# Patient Record
Sex: Female | Born: 1960 | Race: White | Hispanic: No | Marital: Married | Smoking: Current every day smoker
Health system: Southern US, Community
[De-identification: ages and names within clinical notes are randomized; demographics above are authoritative.]

## PROBLEM LIST (undated history)

## (undated) NOTE — ED Notes (Signed)
 Formatting of this note might be different from the original. Quiet , watching TV  Ceda Patti Pouch, RN 03/29/18 1735  Electronically signed by Devona Patti Pouch, RN at 03/29/2018  5:35 PM EST

## (undated) NOTE — ED Notes (Signed)
 Formatting of this note might be different from the original. Nurse Clotilda in room with pt  Electronically signed by Avelina Jenkins Krone, SITTER at 03/28/2018  4:15 PM EST

## (undated) NOTE — ED Notes (Signed)
 Formatting of this note might be different from the original. Pt daughter called to check on mom. Daughter left number for pt to call her at when she wakes up [phone]  [facility] Psych Tech II  Electronically signed by Joesph Norris Humphrey, Tech at 03/29/2018  8:40 AM EST

## (undated) NOTE — ED Notes (Signed)
 Formatting of this note might be different from the original. [facility] AT PT BEDSIDE. Electronically signed by Vinie Annah Bunker, SITTER at 03/30/2018  1:24 PM EST

## (undated) NOTE — ED Notes (Signed)
 Formatting of this note might be different from the original. Pt up to restroom. Electronically signed by Dickie Ezzard Stacks, ED Tech at 03/30/2018  4:42 AM EST

## (undated) NOTE — ED Notes (Signed)
 Formatting of this note might be different from the original. Pt using unit phone. Electronically signed by Dickie Ezzard Stacks, ED Tech at 03/29/2018  7:22 PM EST

## (undated) NOTE — ED Notes (Signed)
 Formatting of this note might be different from the original. Psych Tech informed Pt and daughter about the Pt's IVC status and the plan for the [facility] wait list and refer out to appropriate facility. The Pt and the daughter were upset about this decision. Staff tried to explain that the Dr's and RN felt that she was a danger to herself and others. The Pt was unhappy with this statement and requested to see the paper that she signed that said that. Staff tried to explain that it was not a signed paper and that the Dr filled it out. The Pt's daughter said repeatedly I cannot leave her here Staff explained that the daughter could stay till 10 pm tonight and be back at 8 am the next day. The daughter requested to speak with the Dr that made the decision Staff told the Pt's daughter that would most likely not happen but Staff would ask any ways.   Addendum: Assessment RN spoke with the Pt and the mother about the reason for the IVC   Nidia Rumalda Bucco  Psych Tech II  Electronically signed by Nidia Rumalda Bucco, Tech at 03/28/2018  3:07 PM EST Electronically signed by Nidia Rumalda Bucco, Tech at 03/29/2018  1:50 PM EST

## (undated) NOTE — Consults (Signed)
 Formatting of this note is different from the original. Psychiatry Consult Note [facility] [address] [place], [place] [address] Name: Jackie Hernandez DOB: Aug 08, 1960 HAR: [id number] MRN: [id number] Date: 03/31/2018  Referral from ED Provider Dr. Nancyann Victorio RADDLE  HPI:   This is a 89 y.o. old female who presented to the ED for Mania on 03/28/2018.  Patient has medical history of  Bipolar Disorder. Patient notes that she was in mania  which started 2 weeks ago. Patient reports the severity of this problem is 0/10, currently, with 10 being the most severe. The patient reports the following associated signs and symptoms with their diagnosis lack of sleep, racing thoughts, high energy. The patient reports that This Zyprexa helps makes their symptoms better and that Not taking medications makes their symptoms worse.  Called daughter whom notes that patient is doing better and is willing to bring her home.  Per nurse screener note on 03/31/2018 Pt is a 42 yr old female with a history of Bipolar Disorder.  Pt reports no sleep for last 2 lights and having bad thoughts, racing thoughts.  Pt here with request for Abilify to hold her until she sees her regular psychiatrist in late February or early March.    Pt reports that she used to take Ability Maintenna monthly but stopped it about 2 yrs ago because she thoughts she was better.  She is taking alprazolalm 1-2 mg per day she states to help her calm down.  Pt has rapid and pressured speech during interview.  She appears to be minimizing her symptoms as her daughter often added details that pt failed to mention.  Pt denied hallucinations initially but her daughter talked to her about telling the truth.  Pt then admitted to having unusual smells and adds that she has had olfactory hallucinations in the past.  She reports being hospitalized several times in [place] but denies need for hospitalization today.   Pt's  daughter states that they tried to get her help in the outpatient setting but were unable to get her an appointment. According to pt's daughter pt has had several days of manic behavior and has actually been looking for things in the house that aren't there and destroying things  Psychiatric ROS: Patient denies recent hx of:  Thoughts of harming self or others  helplessness, hopelessness, worthlessness  trouble with sleep, energy, concentration, libido, or appetite  auditory hallucinations, visual hallucinations, paranoia, delusion, or IOR  periods of time with decreased sleep, increased energy, hypereligiosity, or grandiosity  nightmares or flashbacks from any prior traumas   Obessions, compulsions, rituals  overwhelming anxiety, irritability, restlessness or episodes consistent with panic  Medical ROS ROS  Past Psychiatric History: Psychiatrist:  In [place] but has been inactive for 2 yrs Therapist:  Nell but hasn't seen Hospitalizations: several in [facility] and Psychiatric History: Family member with psychiatric or mental illness: None reported   Social History: Living situation:  With husband Marital Status: married Children:  1 Social History   Socioeconomic History  ? Marital status: Married    Spouse name: Not on file  ? Number of children: Not on file  ? Years of education: Not on file  ? Highest education level: Not on file  Occupational History  ? Not on file  Social Needs  ? Financial resource strain: Not on file  ? Food insecurity:    Worry: Not on file    Inability: Not on  file  ? Transportation needs:    Medical: Not on file    Non-medical: Not on file  Tobacco Use  ? Smoking status: Former Smoker  ? Smokeless tobacco: Never Used  Substance and Sexual Activity  ? Alcohol use: Yes  ? Drug use: Never  ? Sexual activity: Not on file  Lifestyle  ? Physical activity:    Days per week: Not on file    Minutes per  session: Not on file  ? Stress: Not on file  Relationships  ? Social connections:    Talks on phone: Not on file    Gets together: Not on file    Attends religious service: Not on file    Active member of club or organization: Not on file    Attends meetings of clubs or organizations: Not on file    Relationship status: Not on file  ? Intimate partner violence:    Fear of current or ex partner: Not on file    Emotionally abused: Not on file    Physically abused: Not on file    Forced sexual activity: Not on file  Other Topics Concern  ? Not on file  Social History Narrative  ? Not on file   Substance Abuse History: Denies any alcohol, tobacco or illicit drug use   Past Medical History: Past Medical History:  Diagnosis Date  ? Bipolar 1 disorder (HCC)   ? Schizophrenia (HCC)    Current Medications: Prior to Admission medications   Medication Sig Start Date End Date Taking? Authorizing Provider  olanzapine zydis (ZYPREXA) 5 MG disintegrating tablet Take 1 tablet (5 mg total) by mouth nightly. 03/31/18 04/30/18  Jordan C. Spellman, MD   Allergies: Allergies not on file  Speciality Physical Exam: Psychiatry  Musculoskeletal: No abnormal movements noted  Neurological: Gait normal   Physical Exam Constitutional: BP 122/89   Pulse 83   Temp 97.9 F (36.6 C)   Resp 16   Ht 5' 6 (1.676 m)   Wt 170 lb (77.1 kg)   SpO2 99%   BMI 27.44 kg/m  General:  Alert, oriented xperson, place and time, no acute distress  Mental Status Exam:  Appearance & Hygiene: Appearance: alert, well appearing, and in no distress and oriented to person, place, and time. Psychomotor Activity:within normal limits Eye Contact: good  Attitude: good  Mood:Euthymic  Affect: appropriate Speech: normal pitch and normal volume Thought Process: logical connections Associations: tight Perception (Hallucinations): denies delusions and hallucinations Thought Content:denies suicidal, homicidal and  paranoid ideation Insight: fair  Judgement: good   Cognition: Orientation:person, place and time Memory: intact Attention & Concentration: intact Language:fluent and spontaneous without dysarthric features General Fund of Knowledge: intact General Cognition: intact  Labs Recent Labs  Lab 03/28/18 1448  WBC 8.1  HGB 14.3  HCT 43.8  PLT 159*   Recent Labs  Lab 03/28/18 1448  NA 145  K 3.8  CL 111*  CO2 27  BUN 18  CREATININE 0.85   Recent Labs  Lab 03/28/18 1448  TBIL 0.8  AST 40*  ALT 52  ALK 116  ALB 4.0  TP 7.5   No results found for: TSHHIGHSENSI No results found for: LITHIUM Urine Drug Screen Latest Ref Rng & Units 03/28/2018  URINE THC Neg Neg  PHENCYCLIDINE Neg Neg  COCAINE METABOLITES Neg Neg  METHAMP UR Neg Neg  OPIATES Neg Neg  AMPHETAMINES Neg Neg  BENZODIAZEPINES Neg Pos(A)  TRICYCLIC ANTIDEP Neg Neg  METHADONE Neg Neg  BARBITURATES  Neg Neg  OXYCODONE , UR Neg Neg  PROPOXYPHENE, UR Neg Neg  BUPRENORPHINE Neg Neg   EKG/Radiology/Procedures No results found for this or any previous visit.  I have personally reviewed the above radiological and Laboratory data.  Review and summary of old records and/or notes: Per ED provider note  Ikea Demicco is a 40 y.o. female who presents stating she has been out of her medication/Abilify for 2 years.  She is currently staying with her daughter.  On further questioning she has not seen her psychiatrist recently she is out of her medication she has been hallucinating both auditory and visual hallucinations.  She has been running around the house moved furniture because voices have been telling her to.    Assessment/Pan --Mania resolved after several days of Zyprexa 5 mg qhs.  --Labs, and imaging, electrocardiogram reviewed as above.  Problem 1. Bipolar I disorder mre Manic  Discharge with Zyprexa 5 mg qhs   Electronically signed by:  Jordan C. Spellman, MD, 03/31/2018, 18:15   Electronically  signed by Jordan C. Mervyn, MD at 03/31/2018  6:15 PM EST

## (undated) NOTE — ED Notes (Signed)
 Formatting of this note might be different from the original. Nurse Clotilda at Pt bedside  Electronically signed by Avelina Jenkins Krone, SITTER at 03/28/2018  4:28 PM EST

## (undated) NOTE — ED Notes (Signed)
 Formatting of this note might be different from the original. Pt up to restroom.  Ginger ale provided. Electronically signed by Dickie Ezzard Stacks, ED Tech at 03/29/2018  9:59 PM EST

## (undated) NOTE — ED Notes (Signed)
 Formatting of this note might be different from the original. Pt up to restroom. Electronically signed by Dickie Ezzard Stacks, ED Tech at 03/30/2018 12:44 AM EST

## (undated) NOTE — ED Notes (Signed)
 Formatting of this note might be different from the original. PT HAS A FEMALE VISITOR AT BEDSIDE. Electronically signed by Vinie Annah Bunker, SITTER at 03/30/2018  1:12 PM EST

## (undated) NOTE — ED Notes (Signed)
 Formatting of this note might be different from the original. Pt daughter on the unit  Electronically signed by Avelina Jenkins Krone, SITTER at 03/28/2018  3:56 PM EST

## (undated) NOTE — ED Notes (Signed)
 Formatting of this note might be different from the original. Psych tech in room speaking with pt. Electronically signed by Dickie Ezzard Stacks, ED Tech at 03/29/2018  9:04 PM EST

## (undated) NOTE — ED Notes (Signed)
 Formatting of this note might be different from the original. Pt up to restroom. Electronically signed by Dickie Ezzard Stacks, ED Tech at 03/30/2018  3:30 AM EST

## (undated) NOTE — ED Notes (Signed)
 Formatting of this note might be different from the original. Psych tech checked in on pt. Pt provided ginger ale. Pt said she was feeling good and declined therapeutic support. pt confused about wether or not she was supposed to be taking medication while she was here. Nurse alerted.   Joesph Lied Psych tech II Electronically signed by Joesph Almarie Lied, Tech at 03/29/2018 10:18 AM EST Electronically signed by Joesph Almarie Lied, Tech at 03/29/2018 10:28 AM EST Electronically signed by Joesph Almarie Humphrey, Tech at 03/29/2018 10:30 AM EST

## (undated) NOTE — ED Notes (Signed)
 Formatting of this note might be different from the original. Pt up to restroom. Electronically signed by Dickie Ezzard Stacks, ED Tech at 03/29/2018  9:26 PM EST

## (undated) NOTE — ED Notes (Signed)
 Formatting of this note might be different from the original. Psych Tech approached Pt to check in on wellbeing. The Pt said that she is calm and feels much better. The Pt attributes this to calming herself down The Pt stated multiple times that she would have not come to the hospital and just pushed through till her Psychiatrist appointment at the end of February. Staff tried to explain the benefits on inpatient but the Pt was not receptive to this. The Pt still thinks that she will be DCed soon.   Staff offered to get outpt reasourses and the Pt agreed to this. A list of Probationers was provided for the [place] area.   Nidia Rumalda Bucco  Psych Tech II  Electronically signed by Nidia Rumalda Bucco, Tech at 03/29/2018  6:23 PM EST Electronically signed by Nidia Rumalda Bucco, Tech at 03/29/2018  6:24 PM EST

## (undated) NOTE — ED Notes (Signed)
 Formatting of this note might be different from the original. Pt up to restroom Electronically signed by Dickie Ezzard Stacks, ED Tech at 03/29/2018  8:22 PM EST

## (undated) NOTE — ED Notes (Signed)
 Formatting of this note might be different from the original. Psych tech checked in on pt. Pt had no requests or desire to talk,   Joesph Lied Psych Tech II  Electronically signed by Joesph Almarie Lied, Tech at 03/31/2018  3:03 PM EST

## (undated) NOTE — ED Notes (Signed)
 Formatting of this note might be different from the original. Chief Complaint:      History of Present Illness:   Pt is a 2 yr old female with a history of Bipolar Disorder.  Pt reports no sleep for last 2 lights and having bad thoughts, racing thoughts.  Pt here with request for Abilify to hold her until she sees her regular psychiatrist in late February or early March.    Pt reports that she used to take Ability Maintenna monthly but stopped it about 2 yrs ago because she thoughts she was better.  She is taking alprazolalm 1-2 mg per day she states to help her calm down.  Pt has rapid and pressured speech during interview.  She appears to be minimizing her symptoms as her daughter often added details that pt failed to mention.  Pt denied hallucinations initially but her daughter talked to her about telling the truth.  Pt then admitted to having unusual smells and adds that she has had olfactory hallucinations in the past.  She reports being hospitalized several times in [place] but denies need for hospitalization today.   Pt's daughter states that they tried to get her help in the outpatient setting but were unable to get her an appointment. According to pt's daughter pt has had several days of manic behavior and has actually been looking for things in the house that aren't there and destroying things.   Ex- Voluntary/ Involuntary?  Involuntary How presented to ed?  Daughter brought in    History of MI?  Bipolar Disorder SI? Plan?  denies HI?Plan?  deies AVH?  olfactory Paranoia? denies  Sleep?  no  Psychiatric History: Psychiatrist:  In [place] but has been inactive for 2 yrs Therapist:  Nell but hasn't seen Hospitalizations: several in [place]   Suicide attempts:  denies Previous medications:  Numerous antidepressants but states only a few mood stabilizers History of seizure or head injury:  denies Suicide completion in the family:  denies  Substance Abuse/Use  History: Alcohol use:  Rare per pt - last drink a glass of wine 3 days ago Illicit drug use:  denies Prescription drug abuse:  denies Smoker:  denies Rehab/Detox:  denies  Medical Issues:  PCP:  none Current issues:  Bipolar Disorder, Schizophrenia per daughter   Home Medications: Compliant /Noncompliant  noncompliant  Social History: Living situation:  With husband Marital Status: married Children:  1 Employment: no Disability:  yes Support System: family Trauma:  denies Education:  High school  History of aggression/ Violence:  denies Access to guns:  denies Legal issues:  denies  Mental Status Exam: Appearance:  Slightly agitated 64 yr old female.  Appropriately dressed in casual clothing Hygiene:  good Speech:  Rapid, pressured Volume:  wnl Type:    Eye Contact:  good Behavior:  Cooperative and hypomanic Affect: anxious Range:  full Mood:  I'm calming myself down Thought Process:  FOI Thought Content:  unremarkable Perceptual Disturbance:  Olfactory hallucinations Psychomotor:  wnl Gait:  wnl  Plan:  Presented to Dr Volanda and he places pt on the [facility] waitlist and refer out for mental health treatment as [facility] is at capacity.  Darice Jerie Ada, RN 03/28/18 [phone]  Electronically signed by Darice Jerie Ada, RN at 03/28/2018  3:07 PM EST

## (undated) NOTE — ED Notes (Signed)
 Formatting of this note might be different from the original. Daughter entered the unit with pt. Electronically signed by Dena Lax, SITTER at 03/28/2018  2:32 PM EST

## (undated) NOTE — ED Notes (Signed)
 Formatting of this note might be different from the original. Daughter has left the unit  Electronically signed by Avelina Jenkins Krone, SITTER at 03/28/2018  4:53 PM EST

## (undated) NOTE — ED Notes (Signed)
 Formatting of this note might be different from the original. DC INSTRUCTIONS REVIEWED WITH PT AND DAUGHTER, BOTH VERBALIZE UNDERSTANDING, PT AMB TO EXIT WITH STEADY GAIT  Ceda Patti Pouch, RN 03/31/18 1550  Electronically signed by Devona Patti Pouch, RN at 03/31/2018  3:50 PM EST

## (undated) NOTE — ED Notes (Signed)
 Formatting of this note might be different from the original. PT VISITOR HAS LEFT THE UNIT. Electronically signed by Vinie Annah Bunker, SITTER at 03/30/2018  1:28 PM EST

## (undated) NOTE — ED Notes (Signed)
 Formatting of this note might be different from the original. Pt is on phone.  Electronically signed by Isaiah Charlies [place], Tech at 03/29/2018  1:36 PM EST

## (undated) NOTE — ED Notes (Signed)
 Formatting of this note might be different from the original. TV channel changed Electronically signed by Dickie Ezzard Stacks, ED Tech at 03/29/2018  7:43 PM EST

## (undated) NOTE — ED Notes (Signed)
 Formatting of this note might be different from the original. Pt's visitor (daughter) left.  Electronically signed by Isaiah Charlies [place], Tech at 03/29/2018  1:12 PM EST Electronically signed by Isaiah Charlies [place], Tech at 03/29/2018  1:12 PM EST

## (undated) NOTE — ED Provider Notes (Signed)
 Formatting of this note is different from the original. eMERGENCY dEPARTMENT eNCOUnter    CHIEF COMPLAINT   Chief Complaint  Patient presents with  ? Depression    hx of bipolar requesting prescription for ambilify which she took years ago, reports increased depression this last week, denies HI/SI. , has appintment with psychiatrist in February  ? Psychotic Symptoms    Reports auditory & visual hallucinations & hx schizophrenia, daughter states, She's ruining the house looking for things not there   HPI   Jackie Hernandez is a 55 y.o. female who presents stating she has been out of her medication/Abilify for 2 years.  She is currently staying with her daughter.  On further questioning she has not seen her psychiatrist recently she is out of her medication she has been hallucinating both auditory and visual hallucinations.  She has been running around the house moved furniture because voices have been telling her to.    PAST MEDICAL HISTORY   Past Medical History:  Diagnosis Date  ? Bipolar 1 disorder (HCC)   ? Schizophrenia (HCC)    SURGICAL HISTORY   No past surgical history on file.  CURRENT MEDICATIONS   See list  ALLERGIES   Allergies not on file  FAMILY HISTORY   No family history on file.  SOCIAL HISTORY   Social History   Socioeconomic History  ? Marital status: Married    Spouse name: Not on file  ? Number of children: Not on file  ? Years of education: Not on file  ? Highest education level: Not on file  Occupational History  ? Not on file  Social Needs  ? Financial resource strain: Not on file  ? Food insecurity:    Worry: Not on file    Inability: Not on file  ? Transportation needs:    Medical: Not on file    Non-medical: Not on file  Tobacco Use  ? Smoking status: Former Smoker  ? Smokeless tobacco: Never Used  Substance and Sexual Activity  ? Alcohol use: Yes  ? Drug use: Never  ? Sexual activity: Not on file  Lifestyle  ? Physical  activity:    Days per week: Not on file    Minutes per session: Not on file  ? Stress: Not on file  Relationships  ? Social connections:    Talks on phone: Not on file    Gets together: Not on file    Attends religious service: Not on file    Active member of club or organization: Not on file    Attends meetings of clubs or organizations: Not on file    Relationship status: Not on file  ? Intimate partner violence:    Fear of current or ex partner: Not on file    Emotionally abused: Not on file    Physically abused: Not on file    Forced sexual activity: Not on file  Other Topics Concern  ? Not on file  Social History Narrative  ? Not on file   REVIEW OF SYSTEMS   Constitutional:  Denies fever, chills, weight loss.   Eyes:  Denies any vision problems. HENT:  Denies sore throat or sinus problems.   Respiratory:  Denies cough or shortness of breath.   Cardiovascular:  Denies chest pain, palpitations or swelling.   GI:  Denies abdominal pain, nausea, vomiting, or diarrhea.   Musculoskeletal:  Denies back pain.   Skin:  Denies rash.   Neurologic:  Denies headache, focal  weakness or sensory changes.    Psychiatric:  HPI   All other systems reviewed and are negative. See HPI for further details.  PHYSICAL EXAM   VITAL SIGNS: BP (!) 144/85   Pulse 78   Temp 98.4 F (36.9 C) (Oral)   Resp 15   Ht 5' 6 (1.676 m)   Wt 170 lb (77.1 kg)   SpO2 100%   BMI 27.44 kg/m  Constitutional:  Well developed, well nourished, no acute distress. Neck:  Normal range of motion, no tenderness, no stiffness or meningismus. HENT:  Normocephalic, atraumatic, bilateral external ears normal, oropharynx moist, no oral exudates, external nose normal.  Eyes:  PERRL, conjunctiva normal, no discharge.  Respiratory:  Normal breath sounds, no respiratory distress, no wheezing, no chest tenderness.  Cardiovascular:  Regularl rhythm, no murmurs, no rubs, no gallops.  GI:   soft, no tenderness, no  masses, no pulsatile masses.  Extremities:  Intact distal pulses, no edema, no tenderness, no cyanosis. No tenderness to palpation or deformities noted.  Back: No midline or CVA tenderness.  Skin:  Warm, dry, no erythema, no rash.  Neurologic:  Awake alert, cranial nerves grossly intact, strength 5/5 symmetric  Psychiatric:  Disheveled   ED COURSE & MEDICAL DECISION MAKING   Pertinent labs & imaging studies reviewed. (See chart for details) Patient with schizophrenia, acute exacerbation and psychosis, patient seen by psychiatry deemed IVC candidate for her safety.  Patient placed on IVC for her safety.  FINAL IMPRESSION   Final diagnoses:  Schizophrenia, unspecified type (HCC)  Hallucinations   Portions of this note may be dictated using  voice recognition software.  Variances in spelling and vocabulary are possible and unintentional.  Not all errors are caught/corrected.  Please notify the dino if any discrepancies are noted or if the meaning of any statement is not clear.  Nancyann LITTIE Victorio Mickey., MD 03/28/18 1525  Electronically signed by Nancyann LITTIE Victorio Mickey., MD at 03/28/2018  3:25 PM EST

## (undated) NOTE — ED Notes (Signed)
 Formatting of this note might be different from the original. PT VISITOR HAS LEFT THE UNIT. Electronically signed by Vinie Annah Bunker, SITTER at 03/30/2018  2:00 PM EST

## (undated) NOTE — ED Notes (Signed)
 Formatting of this note might be different from the original. Lights turned off per pt request. Electronically signed by Dickie Ezzard Stacks, ED Tech at 03/29/2018  8:25 PM EST

## (undated) NOTE — ED Notes (Signed)
 Formatting of this note might be different from the original. PT daughter Edsel Rocher [phone]   Pt is from [place] where she lives with her husband. The Pt and daughter claim that they are in [place] for the week. The Pt believes that she will not be staying in the hospital.  Electronically signed by Nidia Rumalda Bucco, Tech at 03/28/2018  2:51 PM EST

## (undated) NOTE — ED Notes (Signed)
 Formatting of this note might be different from the original. Psych tech checked in on pt. Pt had no requests and declined theraputic support. Will check back in later.   Joesph Lied Psych Tech II  Electronically signed by Joesph Norris Humphrey, Tech at 03/31/2018 11:34 AM EST

## (undated) NOTE — ED Notes (Signed)
 Formatting of this note might be different from the original. Psych tech in room speaking with pt. Electronically signed by Dickie Ezzard Stacks, ED Tech at 03/29/2018  7:22 PM EST

## (undated) NOTE — ED Notes (Signed)
 Formatting of this note might be different from the original. Spoke to patient's daughter with permission from patient. She verbalizes concerns over the IVC process and that she did not understand the process and how the decision was made. I explained the process, however daughter was upset that a psychiatrist did not see her and make the decision. Explained the standard procedures for the IVC process again. Daughter asked for a disposition on patient and was informed that patient was on the [facility] wait list and also referred to other facilities. Daughter continued to express concerns repeatedly and did not want the patient on an IVC. She stated she would be here today to visit and I told her I would speak with her when she arrived.   Rock Earnie Oyster, RN 03/29/18 1355  Electronically signed by Rock Earnie Oyster, RN at 03/29/2018  1:55 PM EST

## (undated) NOTE — ED Notes (Signed)
 Formatting of this note might be different from the original. Patient verbalizes understanding of IVC process and Admission . She agrees to plan and to take medications.   Clotilda Arna Farr, RN 03/28/18 1737  Electronically signed by Clotilda Arna Farr, RN at 03/28/2018  5:37 PM EST

## (undated) NOTE — ED Notes (Signed)
 Formatting of this note might be different from the original. PT UP TO RESTROOM. Electronically signed by Vinie Annah Bunker, SITTER at 03/30/2018  1:32 PM EST

---

## 2008-06-21 ENCOUNTER — Emergency Department (HOSPITAL_COMMUNITY): Admission: EM | Admit: 2008-06-21 | Discharge: 2008-06-21 | Payer: Self-pay | Admitting: Emergency Medicine

## 2008-10-04 ENCOUNTER — Encounter (HOSPITAL_COMMUNITY): Admission: RE | Admit: 2008-10-04 | Discharge: 2008-12-06 | Payer: Self-pay | Admitting: Internal Medicine

## 2008-12-29 ENCOUNTER — Other Ambulatory Visit: Admission: RE | Admit: 2008-12-29 | Discharge: 2008-12-29 | Payer: Self-pay | Admitting: Interventional Radiology

## 2008-12-29 ENCOUNTER — Encounter: Admission: RE | Admit: 2008-12-29 | Discharge: 2008-12-29 | Payer: Self-pay | Admitting: Internal Medicine

## 2008-12-29 ENCOUNTER — Encounter (INDEPENDENT_AMBULATORY_CARE_PROVIDER_SITE_OTHER): Payer: Self-pay | Admitting: Interventional Radiology

## 2009-05-03 ENCOUNTER — Emergency Department (HOSPITAL_COMMUNITY): Admission: EM | Admit: 2009-05-03 | Discharge: 2009-05-03 | Payer: Self-pay | Admitting: Emergency Medicine

## 2009-05-03 ENCOUNTER — Ambulatory Visit: Payer: Self-pay | Admitting: Psychiatry

## 2009-05-03 ENCOUNTER — Inpatient Hospital Stay (HOSPITAL_COMMUNITY): Admission: AD | Admit: 2009-05-03 | Discharge: 2009-05-04 | Payer: Self-pay | Admitting: Psychiatry

## 2009-05-06 ENCOUNTER — Emergency Department (HOSPITAL_COMMUNITY): Admission: EM | Admit: 2009-05-06 | Discharge: 2009-05-06 | Payer: Self-pay | Admitting: Emergency Medicine

## 2009-06-10 ENCOUNTER — Encounter (INDEPENDENT_AMBULATORY_CARE_PROVIDER_SITE_OTHER): Payer: Self-pay | Admitting: *Deleted

## 2009-07-13 ENCOUNTER — Encounter (INDEPENDENT_AMBULATORY_CARE_PROVIDER_SITE_OTHER): Payer: Self-pay | Admitting: *Deleted

## 2009-07-14 ENCOUNTER — Ambulatory Visit: Payer: Self-pay | Admitting: Internal Medicine

## 2009-07-28 ENCOUNTER — Ambulatory Visit: Payer: Self-pay | Admitting: Internal Medicine

## 2009-07-29 ENCOUNTER — Telehealth: Payer: Self-pay | Admitting: Internal Medicine

## 2009-08-02 ENCOUNTER — Encounter: Payer: Self-pay | Admitting: Internal Medicine

## 2010-04-04 NOTE — Letter (Signed)
Summary: [facility] Instructions  [facility]  [address] [place], [place] [address]   Phone: [phone]  Fax: [phone]       Jackie Hernandez    1960/04/16    MRN: [id number]        Procedure Day Dorna Bloom:  Lenor Coffin  07/28/09     Arrival Time:  7:30AM     Procedure Time:  8:30AM     Location of Procedure:                    _ X_  [facility] (4th Floor)    PREPARATION FOR COLONOSCOPY WITH MOVIPREP   Starting 5 days prior to your procedure 07/23/09 do not eat nuts, seeds, popcorn, corn, beans, peas,  salads, or any raw vegetables.  Do not take any fiber supplements (e.g. Metamucil, Citrucel, and Benefiber).  THE DAY BEFORE YOUR PROCEDURE         DATE: 07/27/09  DAY: WEDNESDAY  1.  Drink clear liquids the entire day-NO SOLID FOOD  2.  Do not drink anything colored red or purple.  Avoid juices with pulp.  No orange juice.  3.  Drink at least 64 oz. (8 glasses) of fluid/clear liquids during the day to prevent dehydration and help the prep work efficiently.  CLEAR LIQUIDS INCLUDE: Water Jello Ice Popsicles Tea (sugar ok, no milk/cream) Powdered fruit flavored drinks Coffee (sugar ok, no milk/cream) Gatorade Juice: apple, white grape, white cranberry  Lemonade Clear bullion, consomm, broth Carbonated beverages (any kind) Strained chicken noodle soup Hard Candy                             4.  In the morning, mix first dose of MoviPrep solution:    Empty 1 Pouch A and 1 Pouch B into the disposable container    Add lukewarm drinking water to the top line of the container. Mix to dissolve    Refrigerate (mixed solution should be used within 24 hrs)  5.  Begin drinking the prep at 5:00 p.m. The MoviPrep container is divided by 4 marks.   Every 15 minutes drink the solution down to the next mark (approximately 8 oz) until the full liter is complete.   6.  Follow completed prep with 16 oz of clear liquid of your choice (Nothing red or purple).   Continue to drink clear liquids until bedtime.  7.  Before going to bed, mix second dose of MoviPrep solution:    Empty 1 Pouch A and 1 Pouch B into the disposable container    Add lukewarm drinking water to the top line of the container. Mix to dissolve    Refrigerate  THE DAY OF YOUR PROCEDURE      DATE: 07/28/09  DAY: THURSDAY  Beginning at 3:30AM (5 hours before procedure):         1. Every 15 minutes, drink the solution down to the next mark (approx 8 oz) until the full liter is complete.  2. Follow completed prep with 16 oz. of clear liquid of your choice.    3. You may drink clear liquids until 6:30AM (2 HOURS BEFORE PROCEDURE).   MEDICATION INSTRUCTIONS  Unless otherwise instructed, you should take regular prescription medications with a small sip of water   as early as possible the morning of your procedure.           OTHER INSTRUCTIONS  You will need a  responsible adult at least 52 years of age to accompany you and drive you home.   This person must remain in the waiting room during your procedure.  Wear loose fitting clothing that is easily removed.  Leave jewelry and other valuables at home.  However, you may wish to bring a book to read or  an iPod/MP3 player to listen to music as you wait for your procedure to start.  Remove all body piercing jewelry and leave at home.  Total time from sign-in until discharge is approximately 2-3 hours.  You should go home directly after your procedure and rest.  You can resume normal activities the  day after your procedure.  The day of your procedure you should not:   Drive   Make legal decisions   Operate machinery   Drink alcohol   Return to work  You will receive specific instructions about eating, activities and medications before you leave.    The above instructions have been reviewed and explained to me by   Clide Cliff, RN______________________    I fully understand and can verbalize these  instructions _____________________________ Date _________

## 2010-04-04 NOTE — Progress Notes (Signed)
Summary: Procedure yesterday  Phone Note Call from Patient Call back at Home Phone [phone]   Call For: Dr Marina Goodell Summary of Call: Had a procedure yesterday and would like to ask nurse a couple of questions. Initial call taken by: Leanor Kail [facility],  Jul 29, 2009 1:32 PM  Follow-up for Phone Call        answered pt's questions regarding results of colonoscopy, pt had no other questions by end of phone call. Follow-up by: Oda Cogan RN,  Jul 29, 2009 1:40 PM

## 2010-04-04 NOTE — Miscellaneous (Signed)
Summary: previsit  Clinical Lists Changes  Medications: Added new medication of MOVIPREP 100 GM  SOLR (PEG-KCL-NACL-NASULF-NA ASC-C) As directed - Signed Rx of MOVIPREP 100 GM  SOLR (PEG-KCL-NACL-NASULF-NA ASC-C) As directed;  #1 x 0;  Signed;  Entered by: Clide Cliff RN;  Authorized by: Hilarie Fredrickson MD;  Method used: Electronically to [facility]  [address] [phone]*, [address] [place], [place], [place]  [address], Ph: [phone] or [phone], Fax: [phone] Observations: Added new observation of ALLERGY REV: Done (07/14/2009 10:00)    Prescriptions: MOVIPREP 100 GM  SOLR (PEG-KCL-NACL-NASULF-NA ASC-C) As directed  #1 x 0   Entered by:   Clide Cliff RN   Authorized by:   Hilarie Fredrickson MD   Signed by:   Clide Cliff RN on 07/14/2009   Method used:   Electronically to        [facility]  [address] [phone]* (retail)       [address]       [place], [place]  [address]       Ph: [phone] or [phone]       Fax: [phone]   RxID:   [id number]

## 2010-04-04 NOTE — Letter (Signed)
Summary: Patient Notice- Polyp Results  [facility]  [address] [place], [place] [address]   Phone: [phone]  Fax: [phone]        Aug 02, 2009 MRN: [id number]    [facility] [address] [place], [place]  [address]    Dear Ms. Jackie Hernandez,  I am pleased to inform you that the colon polyps removed during your recent colonoscopy were found to be benign (no cancer detected) upon pathologic examination.  I recommend you have a repeat colonoscopy examination in ONE year to look for recurrent polyps, as having colon polyps increases your risk for having recurrent polyps or even colon cancer in the future.  Should you develop new or worsening symptoms of abdominal pain, bowel habit changes or bleeding from the rectum or bowels, please schedule an evaluation with either your primary care physician or with me.  Additional information/recommendations:  __ No further action with gastroenterology is needed at this time. Please      follow-up with your primary care physician for your other healthcare      needs.  Please call us if you are having persistent problems or have questions about your condition that have not been fully answered at this time.  Sincerely,  Hilarie Fredrickson MD  This letter has been electronically signed by your physician.  Appended Document: Patient Notice- Polyp Results letter mailed

## 2010-04-04 NOTE — Procedures (Signed)
Summary: Colonoscopy  Patient: [name] Note: All result statuses are Final unless otherwise noted.  Tests: (1) Colonoscopy (COL)   COL Colonoscopy           DONE     [facility]     [address]     [place], [place]  [address]           COLONOSCOPY PROCEDURE REPORT           PATIENT:  Jackie Hernandez, Jackie Hernandez  MR#:  [id number]     BIRTHDATE:  01/22/1961, 48 yrs. old  GENDER:  female     ENDOSCOPIST:  Wilhemina Bonito. Eda Keys, MD     REF. BY:  Creola Corn, M.D.     PROCEDURE DATE:  07/28/2009     PROCEDURE:  Colonoscopy with snare polypectomy x 4     Colonoscopy with submucosal     injection of saline     Colon w/ endoscopic clipping x one     ASA CLASS:  Class I     INDICATIONS:  Routine Risk Screening     MEDICATIONS:   Fentanyl 100 mcg IV, Versed 10 mg IV, Benadryl 50     mg IV           DESCRIPTION OF PROCEDURE:   After the risks benefits and     alternatives of the procedure were thoroughly explained, informed     consent was obtained.  Digital rectal exam was performed and     revealed no abnormalities.   The LB CF-[id number] endoscope     was introduced through the anus and advanced to the cecum, which     was identified by both the appendix and ileocecal valve, without     limitations.Time to cecum = 4:36 min.  The quality of the prep was     excellent, using MoviPrep.  The instrument was then slowly     withdrawn (time = 31:00 min)as the colon was fully examined.     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  A 20mm     sessile polyp was found in the cecum. Polyp was raised entirely     with 18cc normal saline and then snared, then cauterized with     monopolar cautery. The polyp was essentially removed in one     piece.Retrieval was successful. The post polypectomy defect was     examined and appearred to have a visible vessel in the mid     portion. thus, it was elected to close the defect over the vessel     with a  single Resolution clip.  Three additional polyps (3-76mm)     were found in the ascending colon. Polyps were snared without     cautery. Retrieval was successful.   Moderate diverticulosis was     found in the sigmoid colon.   Retroflexed views in the rectum     revealed no abnormalities.    The scope was then withdrawn from     the patient and the procedure completed.           COMPLICATIONS:  None     ENDOSCOPIC IMPRESSION:     1) Melanosis throughout the colon     2) Sessile polyp in the cecum - removed (see above)     3) Three small polyps in the ascending colon - removed     4) Moderate  diverticulosis in the sigmoid colon           RECOMMENDATIONS:     1) Follow up colonoscopy in ONE year           ______________________________     Wilhemina Bonito. Eda Keys, MD           CC:  Creola Corn, MD; The Patient           n.     eSIGNED:   Wilhemina Bonito. Eda Keys at 07/28/2009 09:56 AM           Gerald Stabs, [id number]  Note: An exclamation mark (!) indicates a result that was not dispersed into the flowsheet. Document Creation Date: 07/28/2009 9:57 AM _______________________________________________________________________  (1) Order result status: Final Collection or observation date-time: 07/28/2009 09:37 Requested date-time:  Receipt date-time:  Reported date-time:  Referring Physician:   Ordering Physician: Fransico Setters [phone]) Specimen Source:  Source: Launa Grill Order Number: [id number] Lab site:   Appended Document: Colonoscopy     Procedures Next Due Date:    Colonoscopy: 08/2010

## 2010-04-04 NOTE — Letter (Signed)
Summary: Previsit letter  [facility]  [address] [place], [place] [address]   Phone: [phone]  Fax: [phone]       06/10/2009 MRN: [id number]  [facility] [address] [place], [place]  [address]  Dear Ms. Pincus,  Welcome to the Gastroenterology Division at [facility].    You are scheduled to see a nurse for your pre-procedure visit on Jul 14, 2009 at 10:00am on the 3rd floor at [facility], [address]  We ask that you try to arrive at our office 15 minutes prior to your appointment time to allow for check-in.  Your nurse visit will consist of discussing your medical and surgical history, your immediate family medical history, and your medications.    Please bring a complete list of all your medications or, if you prefer, bring the medication bottles and we will list them.  We will need to be aware of both prescribed and over the counter drugs.  We will need to know exact dosage information as well.  If you are on blood thinners (Coumadin, Plavix, Aggrenox, Ticlid, etc.) please call our office today/prior to your appointment, as we need to consult with your physician about holding your medication.   Please be prepared to read and sign documents such as consent forms, a financial agreement, and acknowledgement forms.  If necessary, and with your consent, a friend or relative is welcome to sit-in on the nurse visit with you.  Please bring your insurance card so that we may make a copy of it.  If your insurance requires a referral to see a specialist, please bring your referral form from your primary care physician.  No co-pay is required for this nurse visit.     If you cannot keep your appointment, please call [phone] to cancel or reschedule prior to your appointment date.  This allows [place] the opportunity to schedule an appointment for another patient in need of care.    Thank you for choosing [facility] for your medical needs.   We appreciate the opportunity to care for you.  Please visit [place] at our website  to learn more about our practice.                     Sincerely.                                                                                                                   The Gastroenterology Division

## 2010-05-29 LAB — URINALYSIS, ROUTINE W REFLEX MICROSCOPIC
Hgb urine dipstick: NEGATIVE
Ketones, ur: 15 mg/dL — AB
Nitrite: NEGATIVE
Protein, ur: 30 mg/dL — AB
Specific Gravity, Urine: 1.03 (ref 1.005–1.030)
Urobilinogen, UA: 1 mg/dL (ref 0.0–1.0)

## 2010-05-29 LAB — RAPID URINE DRUG SCREEN, HOSP PERFORMED
Amphetamines: NOT DETECTED
Barbiturates: NOT DETECTED
Barbiturates: NOT DETECTED
Benzodiazepines: POSITIVE — AB
Cocaine: NOT DETECTED
Opiates: NOT DETECTED
Tetrahydrocannabinol: NOT DETECTED

## 2010-05-29 LAB — URINE MICROSCOPIC-ADD ON

## 2010-05-29 LAB — BASIC METABOLIC PANEL
CO2: 24 mEq/L (ref 19–32)
CO2: 25 mEq/L (ref 19–32)
Calcium: 9.1 mg/dL (ref 8.4–10.5)
Chloride: 107 mEq/L (ref 96–112)
Creatinine, Ser: 0.82 mg/dL (ref 0.4–1.2)
GFR calc Af Amer: >60 mL/min (ref >60)
GFR calc Af Amer: >60 mL/min (ref >60)
GFR calc non Af Amer: >60 mL/min (ref >60)
GFR calc non Af Amer: >60 mL/min (ref >60)
Potassium: 3.2 mEq/L — ABNORMAL LOW (ref 3.5–5.1)
Sodium: 139 mEq/L (ref 135–145)
Sodium: 139 mEq/L (ref 135–145)

## 2010-05-29 LAB — CBC
HCT: 48.7 % — ABNORMAL HIGH (ref 36.0–46.0)
Hemoglobin: 16.7 g/dL — ABNORMAL HIGH (ref 12.0–15.0)
MCHC: 34.4 g/dL (ref 30.0–36.0)

## 2010-05-29 LAB — DIFFERENTIAL
Eosinophils Absolute: 0 10*3/uL (ref 0.0–0.7)
Eosinophils Relative: 0 % (ref 0–5)
Lymphs Abs: 2.6 10*3/uL (ref 0.7–4.0)
Neutro Abs: 5.4 10*3/uL (ref 1.7–7.7)

## 2010-05-29 LAB — ETHANOL
Alcohol, Ethyl (B): 98 mg/dL — ABNORMAL HIGH (ref 0–10)
Alcohol, Ethyl (B): <5 mg/dL (ref 0–10)

## 2010-05-29 LAB — PREGNANCY, URINE: Preg Test, Ur: NEGATIVE

## 2010-06-14 LAB — POCT I-STAT, CHEM 8
BUN: 15 mg/dL (ref 6–23)
Calcium, Ion: 1.23 mmol/L (ref 1.12–1.32)
HCT: 47 % — ABNORMAL HIGH (ref 36.0–46.0)
Potassium: 3.8 mEq/L (ref 3.5–5.1)
Sodium: 140 mEq/L (ref 135–145)

## 2010-06-14 LAB — POCT CARDIAC MARKERS: Myoglobin, poc: 46.1 ng/mL (ref 12–200)

## 2010-09-15 ENCOUNTER — Encounter: Payer: Self-pay | Admitting: Internal Medicine

## 2011-02-16 IMAGING — CT CT ANGIO CHEST
2 of 6 series · 18 of 36 positions shown · IV contrast (APPLIED)
Comparison: Portable chest radiograph 06/21/2008.

CLINICAL DATA: 47-year-old female with upper mid chest pain since
yesterday afternoon.

CT ANGIOGRAPHY CHEST
TECHNIQUE: Multidetector CT imaging of the chest was performed
using the standard protocol during bolus administration of
intravenous contrast. Multiplanar CT image reconstructions
including MIPs were obtained to evaluate the vascular anatomy.
Contrast: 100 ml 3mnipaque-PJJ.

[Series 8: pulm embolism 1.0 b25f thins · axial · 0.66mm/px · z∈[+828,+1102]mm · 17 of 306 slices shown]
[im 16/306  lung]
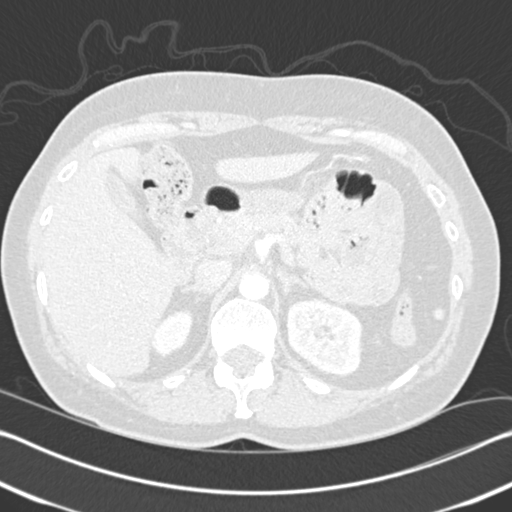
[im 31/306  mediastinal]
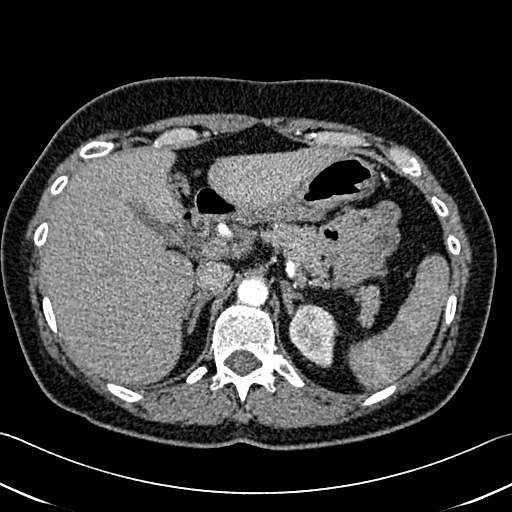
[im 46/306  lung]
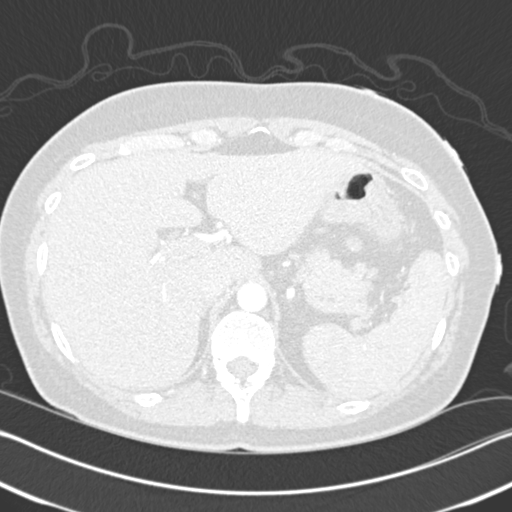
[im 62/306  mediastinal]
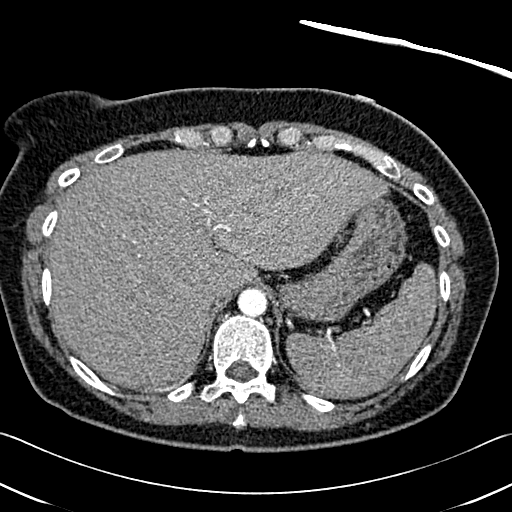
[im 92/306  lung]
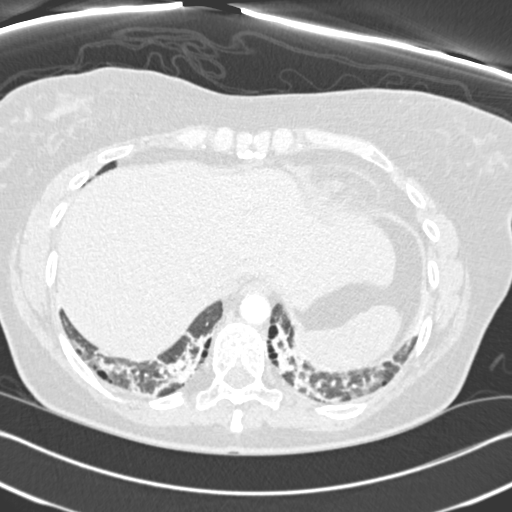
[im 107/306  mediastinal]
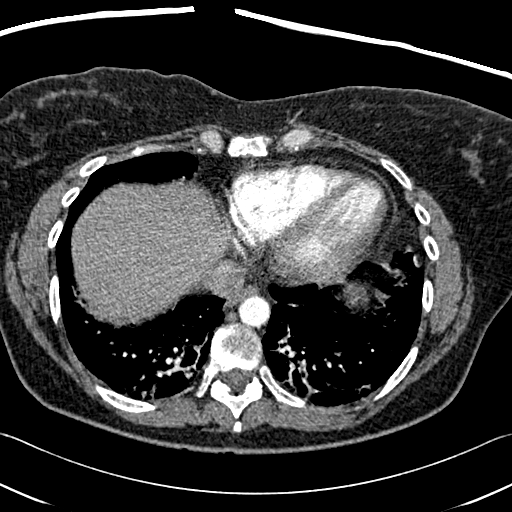
[im 123/306  lung]
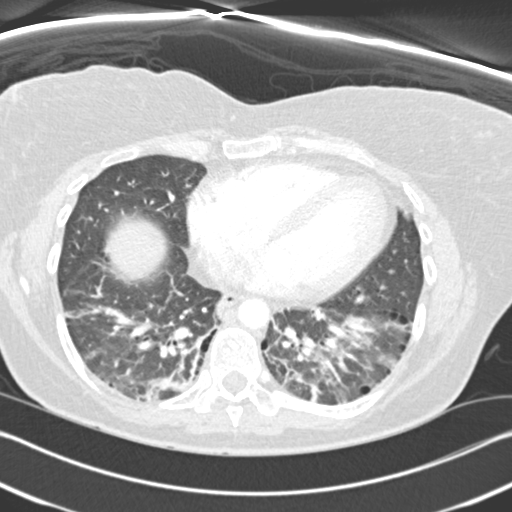
[im 138/306  mediastinal]
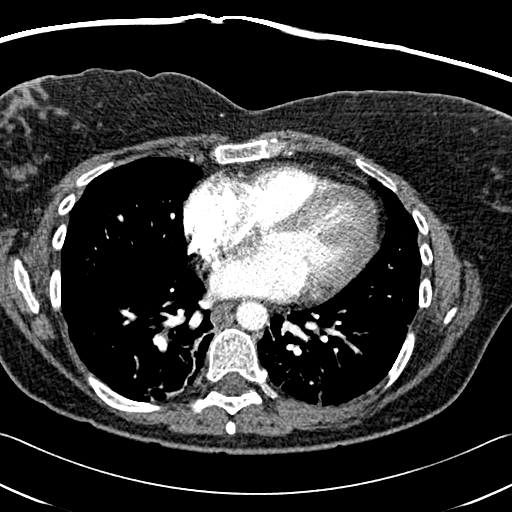
[im 153/306  lung]
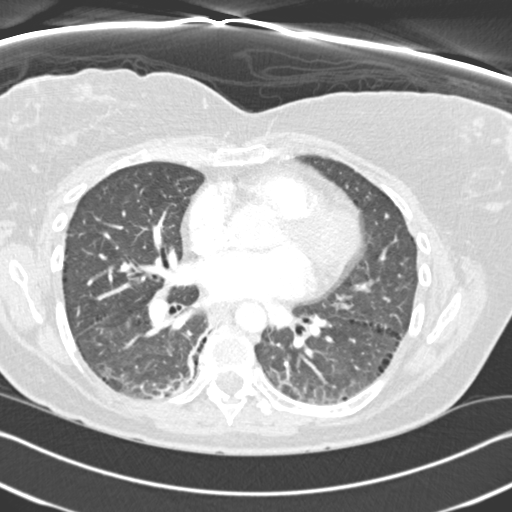
[im 168/306  mediastinal]
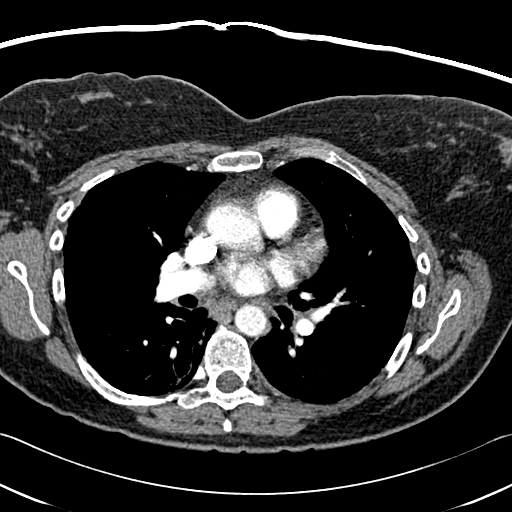
[im 184/306  lung]
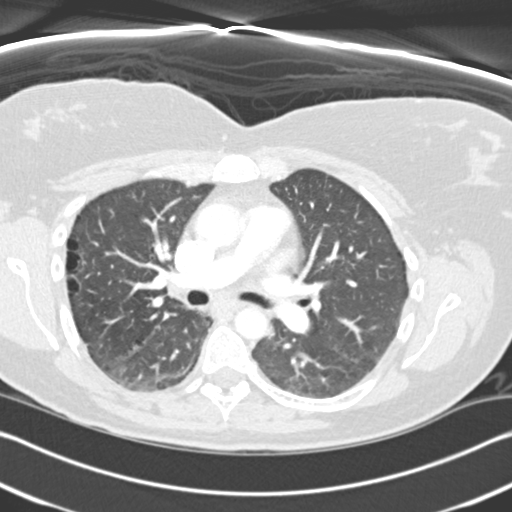
[im 199/306  mediastinal]
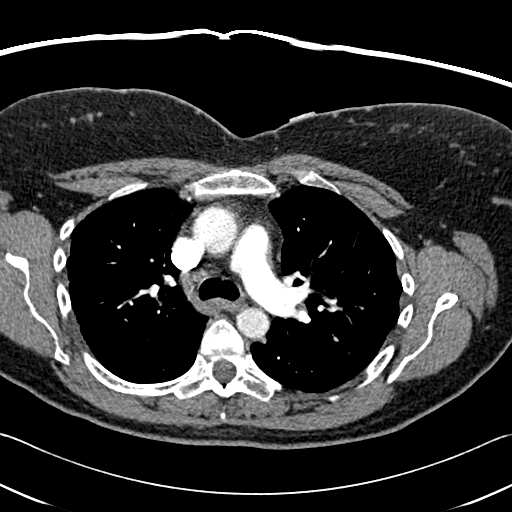
[im 214/306  lung]
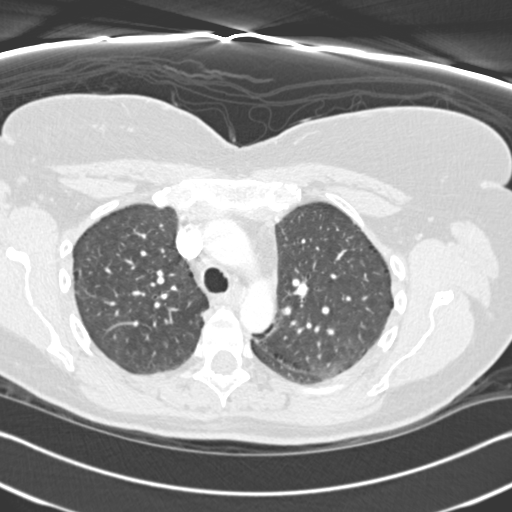
[im 245/306  mediastinal]
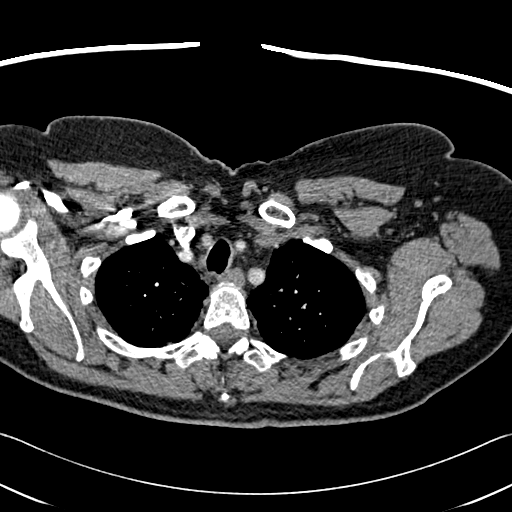
[im 260/306  lung]
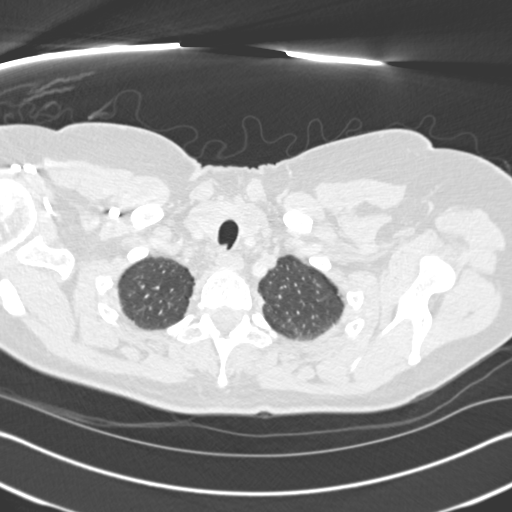
[im 275/306  mediastinal]
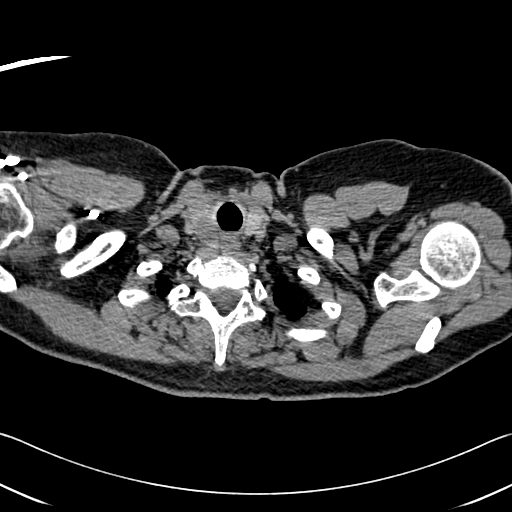
[im 290/306  lung]
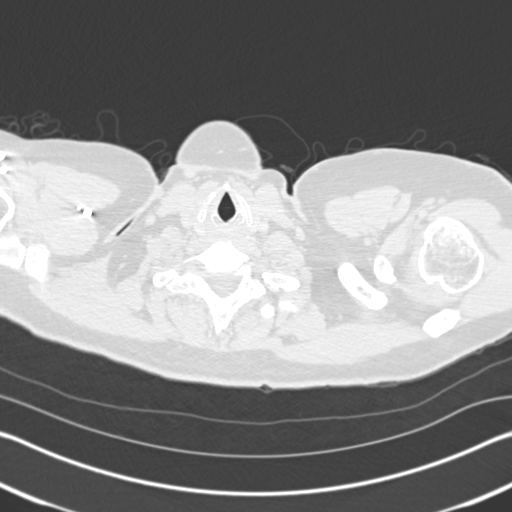

[Series 602: coronals · coronal · 0.66mm/px · 1 of 64 slices shown]
[im 32/64  mediastinal]
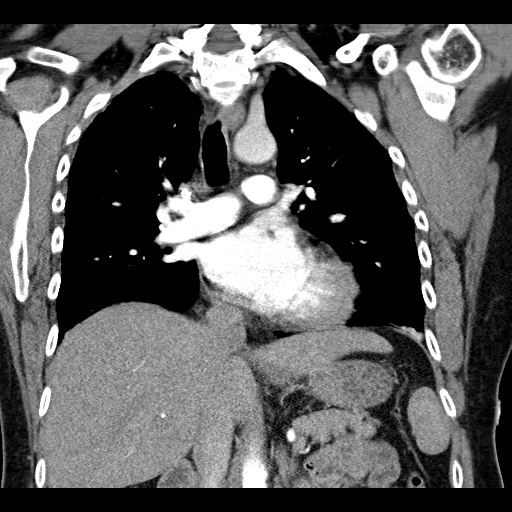

[18 of 36 positions shown; findings below may reference images not displayed]

FINDINGS: Good contrast bolus timing in the pulmonary arterial
tree.  No focal filling defect identified in the pulmonary arterial
tree to suggest the presence of acute pulmonary embolism.

There is bilateral lower lobe architectural distortion in the
setting of diffuse paraseptal emphysema.  And no air bronchograms
or associated airspace consolidation is identified.  The appearance
favors scarring.  Major airways are patent.  Elsewhere, no focal or
confluent pulmonary opacity is identified.

There is a 13 x 17 mm hypodense right thyroid nodule.  Visualized
thoracic inlet is otherwise within normal limits.  No pericardial
or pleural effusion.  No mediastinal or hilar lymphadenopathy.
Visualized aorta is within normal limits.  No axillary
lymphadenopathy.

There is a 5 x 9 mm low density area in the right hepatic dome, too
small to characterize.  Otherwise, visualized liver, gallbladder,
spleen, pancreas, adrenal, and renal parenchyma are within normal
limits.  Visualized noncontrasted bowel is within normal limits.

No acute osseous abnormality identified.

 Review of the MIP images confirms the above findings.
IMPRESSION: 1. No evidence of acute pulmonary embolus.
2.  Bilateral lower lobe architectural distortion and diffuse
paraseptal emphysema, compatible with chronic lung disease.
3.  Right thyroid hypodense nodule measuring 13 x 17 mm.  Recommend
nonemergent thyroid ultrasound follow-up.
4.  Sub centimeter low density area right hepatic lobe, favor
benign but too small to characterize.

## 2011-04-11 ENCOUNTER — Other Ambulatory Visit: Payer: Self-pay | Admitting: Internal Medicine

## 2011-04-11 DIAGNOSIS — E041 Nontoxic single thyroid nodule: Secondary | ICD-10-CM

## 2011-06-01 ENCOUNTER — Other Ambulatory Visit: Payer: Self-pay

## 2011-12-04 ENCOUNTER — Encounter: Payer: Self-pay | Admitting: Internal Medicine

## 2014-03-15 DIAGNOSIS — F250 Schizoaffective disorder, bipolar type: Secondary | ICD-10-CM | POA: Diagnosis not present

## 2014-03-24 DIAGNOSIS — F250 Schizoaffective disorder, bipolar type: Secondary | ICD-10-CM | POA: Diagnosis not present

## 2014-04-12 DIAGNOSIS — F250 Schizoaffective disorder, bipolar type: Secondary | ICD-10-CM | POA: Diagnosis not present

## 2014-05-10 DIAGNOSIS — F250 Schizoaffective disorder, bipolar type: Secondary | ICD-10-CM | POA: Diagnosis not present

## 2014-06-07 DIAGNOSIS — F250 Schizoaffective disorder, bipolar type: Secondary | ICD-10-CM | POA: Diagnosis not present

## 2014-07-05 DIAGNOSIS — F250 Schizoaffective disorder, bipolar type: Secondary | ICD-10-CM | POA: Diagnosis not present

## 2014-07-07 ENCOUNTER — Encounter: Payer: Self-pay | Admitting: Internal Medicine

## 2014-07-31 ENCOUNTER — Emergency Department (HOSPITAL_COMMUNITY)
Admission: EM | Admit: 2014-07-31 | Discharge: 2014-07-31 | Disposition: A | Discharge status: Home / Self Care | Payer: BLUE CROSS/BLUE SHIELD | Attending: Emergency Medicine | Admitting: Emergency Medicine

## 2014-07-31 ENCOUNTER — Encounter (HOSPITAL_COMMUNITY): Payer: Self-pay | Admitting: *Deleted

## 2014-07-31 DIAGNOSIS — Y9389 Activity, other specified: Secondary | ICD-10-CM | POA: Insufficient documentation

## 2014-07-31 DIAGNOSIS — S20219A Contusion of unspecified front wall of thorax, initial encounter: Secondary | ICD-10-CM | POA: Insufficient documentation

## 2014-07-31 DIAGNOSIS — Y92410 Unspecified street and highway as the place of occurrence of the external cause: Secondary | ICD-10-CM | POA: Diagnosis not present

## 2014-07-31 DIAGNOSIS — T148XXA Other injury of unspecified body region, initial encounter: Secondary | ICD-10-CM

## 2014-07-31 DIAGNOSIS — Y998 Other external cause status: Secondary | ICD-10-CM | POA: Diagnosis not present

## 2014-07-31 DIAGNOSIS — S299XXA Unspecified injury of thorax, initial encounter: Secondary | ICD-10-CM | POA: Diagnosis present

## 2014-07-31 DIAGNOSIS — Z720 Tobacco use: Secondary | ICD-10-CM | POA: Diagnosis not present

## 2014-07-31 MED ORDER — OXYCODONE-ACETAMINOPHEN 5-325 MG PO TABS: 1.0000 | ORAL_TABLET | Freq: Four times a day (QID) | ORAL | Status: AC | PRN

## 2014-07-31 NOTE — Discharge Instructions (Signed)
Contusion °A contusion is a deep bruise. Contusions happen when an injury causes bleeding under the skin. Signs of bruising include pain, puffiness (swelling), and discolored skin. The contusion may turn blue, purple, or yellow. °HOME CARE  °· Put ice on the injured area. °¨ Put ice in a plastic bag. °¨ Place a towel between your skin and the bag. °¨ Leave the ice on for 15-20 minutes, 03-04 times a day. °· Only take medicine as told by your doctor. °· Rest the injured area. °· If possible, raise (elevate) the injured area to lessen puffiness. °GET HELP RIGHT AWAY IF:  °· You have more bruising or puffiness. °· You have pain that is getting worse. °· Your puffiness or pain is not helped by medicine. °MAKE SURE YOU:  °· Understand these instructions. °· Will watch your condition. °· Will get help right away if you are not doing well or get worse. °Document Released: 08/08/2007 Document Revised: 05/14/2011 Document Reviewed: 12/25/2010 °[employer]® Patient Information ©2015 [employer]. This information is not intended to replace advice given to you by your health care provider. Make sure you discuss any questions you have with your health care provider. ° °
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
## 2014-08-30 DIAGNOSIS — F250 Schizoaffective disorder, bipolar type: Secondary | ICD-10-CM | POA: Diagnosis not present

## 2014-10-26 DIAGNOSIS — F250 Schizoaffective disorder, bipolar type: Secondary | ICD-10-CM | POA: Diagnosis not present

## 2014-12-06 DIAGNOSIS — F250 Schizoaffective disorder, bipolar type: Secondary | ICD-10-CM | POA: Diagnosis not present

## 2014-12-16 DIAGNOSIS — F250 Schizoaffective disorder, bipolar type: Secondary | ICD-10-CM | POA: Diagnosis not present

## 2014-12-20 DIAGNOSIS — F250 Schizoaffective disorder, bipolar type: Secondary | ICD-10-CM | POA: Diagnosis not present

## 2015-01-03 DIAGNOSIS — F250 Schizoaffective disorder, bipolar type: Secondary | ICD-10-CM | POA: Diagnosis not present

## 2015-01-18 DIAGNOSIS — F250 Schizoaffective disorder, bipolar type: Secondary | ICD-10-CM | POA: Diagnosis not present

## 2015-02-01 DIAGNOSIS — F250 Schizoaffective disorder, bipolar type: Secondary | ICD-10-CM | POA: Diagnosis not present

## 2015-02-15 DIAGNOSIS — F250 Schizoaffective disorder, bipolar type: Secondary | ICD-10-CM | POA: Diagnosis not present

## 2015-03-07 DIAGNOSIS — F250 Schizoaffective disorder, bipolar type: Secondary | ICD-10-CM | POA: Diagnosis not present

## 2015-04-04 DIAGNOSIS — F250 Schizoaffective disorder, bipolar type: Secondary | ICD-10-CM | POA: Diagnosis not present

## 2015-05-04 DIAGNOSIS — F250 Schizoaffective disorder, bipolar type: Secondary | ICD-10-CM | POA: Diagnosis not present

## 2015-06-06 DIAGNOSIS — F250 Schizoaffective disorder, bipolar type: Secondary | ICD-10-CM | POA: Diagnosis not present

## 2015-06-14 DIAGNOSIS — F250 Schizoaffective disorder, bipolar type: Secondary | ICD-10-CM | POA: Diagnosis not present

## 2015-07-04 DIAGNOSIS — F250 Schizoaffective disorder, bipolar type: Secondary | ICD-10-CM | POA: Diagnosis not present

## 2015-07-19 DIAGNOSIS — F250 Schizoaffective disorder, bipolar type: Secondary | ICD-10-CM | POA: Diagnosis not present

## 2015-08-08 DIAGNOSIS — F250 Schizoaffective disorder, bipolar type: Secondary | ICD-10-CM | POA: Diagnosis not present

## 2015-09-12 DIAGNOSIS — F250 Schizoaffective disorder, bipolar type: Secondary | ICD-10-CM | POA: Diagnosis not present

## 2015-09-19 DIAGNOSIS — F250 Schizoaffective disorder, bipolar type: Secondary | ICD-10-CM | POA: Diagnosis not present

## 2015-10-13 DIAGNOSIS — Z1389 Encounter for screening for other disorder: Secondary | ICD-10-CM | POA: Diagnosis not present

## 2015-10-13 DIAGNOSIS — E668 Other obesity: Secondary | ICD-10-CM | POA: Diagnosis not present

## 2015-10-13 DIAGNOSIS — M859 Disorder of bone density and structure, unspecified: Secondary | ICD-10-CM | POA: Diagnosis not present

## 2015-10-13 DIAGNOSIS — K635 Polyp of colon: Secondary | ICD-10-CM | POA: Diagnosis not present

## 2015-10-13 DIAGNOSIS — E781 Pure hyperglyceridemia: Secondary | ICD-10-CM | POA: Diagnosis not present

## 2015-10-13 DIAGNOSIS — E041 Nontoxic single thyroid nodule: Secondary | ICD-10-CM | POA: Diagnosis not present

## 2015-10-13 DIAGNOSIS — F250 Schizoaffective disorder, bipolar type: Secondary | ICD-10-CM | POA: Diagnosis not present

## 2015-10-13 DIAGNOSIS — Z79899 Other long term (current) drug therapy: Secondary | ICD-10-CM | POA: Diagnosis not present

## 2015-10-13 DIAGNOSIS — F418 Other specified anxiety disorders: Secondary | ICD-10-CM | POA: Diagnosis not present

## 2015-10-13 DIAGNOSIS — F3189 Other bipolar disorder: Secondary | ICD-10-CM | POA: Diagnosis not present

## 2015-10-13 DIAGNOSIS — F17210 Nicotine dependence, cigarettes, uncomplicated: Secondary | ICD-10-CM | POA: Diagnosis not present

## 2015-10-14 ENCOUNTER — Other Ambulatory Visit: Payer: Self-pay | Admitting: Internal Medicine

## 2015-10-14 DIAGNOSIS — E041 Nontoxic single thyroid nodule: Secondary | ICD-10-CM

## 2015-10-18 DIAGNOSIS — F250 Schizoaffective disorder, bipolar type: Secondary | ICD-10-CM | POA: Diagnosis not present

## 2015-11-14 DIAGNOSIS — F250 Schizoaffective disorder, bipolar type: Secondary | ICD-10-CM | POA: Diagnosis not present

## 2015-11-15 DIAGNOSIS — F250 Schizoaffective disorder, bipolar type: Secondary | ICD-10-CM | POA: Diagnosis not present

## 2015-11-25 ENCOUNTER — Encounter: Payer: Self-pay | Admitting: Internal Medicine

## 2015-12-12 DIAGNOSIS — F250 Schizoaffective disorder, bipolar type: Secondary | ICD-10-CM | POA: Diagnosis not present

## 2015-12-14 DIAGNOSIS — F250 Schizoaffective disorder, bipolar type: Secondary | ICD-10-CM | POA: Diagnosis not present

## 2016-01-11 DIAGNOSIS — F250 Schizoaffective disorder, bipolar type: Secondary | ICD-10-CM | POA: Diagnosis not present

## 2016-02-15 DIAGNOSIS — F250 Schizoaffective disorder, bipolar type: Secondary | ICD-10-CM | POA: Diagnosis not present

## 2016-02-15 DIAGNOSIS — R69 Illness, unspecified: Secondary | ICD-10-CM | POA: Diagnosis not present

## 2016-03-14 DIAGNOSIS — R69 Illness, unspecified: Secondary | ICD-10-CM | POA: Diagnosis not present

## 2016-03-14 DIAGNOSIS — F250 Schizoaffective disorder, bipolar type: Secondary | ICD-10-CM | POA: Diagnosis not present

## 2016-04-06 DIAGNOSIS — E781 Pure hyperglyceridemia: Secondary | ICD-10-CM | POA: Diagnosis not present

## 2016-04-06 DIAGNOSIS — R8299 Other abnormal findings in urine: Secondary | ICD-10-CM | POA: Diagnosis not present

## 2016-04-06 DIAGNOSIS — E041 Nontoxic single thyroid nodule: Secondary | ICD-10-CM | POA: Diagnosis not present

## 2016-04-06 DIAGNOSIS — M859 Disorder of bone density and structure, unspecified: Secondary | ICD-10-CM | POA: Diagnosis not present

## 2016-04-11 DIAGNOSIS — R69 Illness, unspecified: Secondary | ICD-10-CM | POA: Diagnosis not present

## 2016-04-11 DIAGNOSIS — F250 Schizoaffective disorder, bipolar type: Secondary | ICD-10-CM | POA: Diagnosis not present

## 2016-04-13 DIAGNOSIS — F3189 Other bipolar disorder: Secondary | ICD-10-CM | POA: Diagnosis not present

## 2016-04-13 DIAGNOSIS — R8299 Other abnormal findings in urine: Secondary | ICD-10-CM | POA: Diagnosis not present

## 2016-04-13 DIAGNOSIS — K635 Polyp of colon: Secondary | ICD-10-CM | POA: Diagnosis not present

## 2016-04-13 DIAGNOSIS — E041 Nontoxic single thyroid nodule: Secondary | ICD-10-CM | POA: Diagnosis not present

## 2016-04-13 DIAGNOSIS — F250 Schizoaffective disorder, bipolar type: Secondary | ICD-10-CM | POA: Diagnosis not present

## 2016-04-13 DIAGNOSIS — E781 Pure hyperglyceridemia: Secondary | ICD-10-CM | POA: Diagnosis not present

## 2016-04-13 DIAGNOSIS — Z6830 Body mass index (BMI) 30.0-30.9, adult: Secondary | ICD-10-CM | POA: Diagnosis not present

## 2016-04-13 DIAGNOSIS — Z0000 Encounter for general adult medical examination without abnormal findings: Secondary | ICD-10-CM | POA: Diagnosis not present

## 2016-04-13 DIAGNOSIS — E669 Obesity, unspecified: Secondary | ICD-10-CM | POA: Diagnosis not present

## 2016-04-13 DIAGNOSIS — E559 Vitamin D deficiency, unspecified: Secondary | ICD-10-CM | POA: Diagnosis not present

## 2016-04-13 DIAGNOSIS — Z1389 Encounter for screening for other disorder: Secondary | ICD-10-CM | POA: Diagnosis not present

## 2016-04-13 DIAGNOSIS — Z79899 Other long term (current) drug therapy: Secondary | ICD-10-CM | POA: Diagnosis not present

## 2016-04-13 DIAGNOSIS — R69 Illness, unspecified: Secondary | ICD-10-CM | POA: Diagnosis not present

## 2016-04-13 DIAGNOSIS — M859 Disorder of bone density and structure, unspecified: Secondary | ICD-10-CM | POA: Diagnosis not present

## 2016-04-16 DIAGNOSIS — Z1212 Encounter for screening for malignant neoplasm of rectum: Secondary | ICD-10-CM | POA: Diagnosis not present

## 2016-05-09 DIAGNOSIS — R69 Illness, unspecified: Secondary | ICD-10-CM | POA: Diagnosis not present

## 2016-05-09 DIAGNOSIS — F250 Schizoaffective disorder, bipolar type: Secondary | ICD-10-CM | POA: Diagnosis not present

## 2016-06-06 DIAGNOSIS — F250 Schizoaffective disorder, bipolar type: Secondary | ICD-10-CM | POA: Diagnosis not present

## 2016-06-06 DIAGNOSIS — R69 Illness, unspecified: Secondary | ICD-10-CM | POA: Diagnosis not present

## 2016-06-11 DIAGNOSIS — F250 Schizoaffective disorder, bipolar type: Secondary | ICD-10-CM | POA: Diagnosis not present

## 2016-06-11 DIAGNOSIS — R69 Illness, unspecified: Secondary | ICD-10-CM | POA: Diagnosis not present

## 2018-03-28 DIAGNOSIS — F209 Schizophrenia, unspecified: Secondary | ICD-10-CM | POA: Diagnosis not present

## 2018-03-28 DIAGNOSIS — R69 Illness, unspecified: Secondary | ICD-10-CM | POA: Diagnosis not present

## 2018-03-28 DIAGNOSIS — R441 Visual hallucinations: Secondary | ICD-10-CM | POA: Diagnosis not present

## 2018-03-28 DIAGNOSIS — R440 Auditory hallucinations: Secondary | ICD-10-CM | POA: Diagnosis not present

## 2018-03-31 DIAGNOSIS — F3110 Bipolar disorder, current episode manic without psychotic features, unspecified: Secondary | ICD-10-CM | POA: Diagnosis not present

## 2018-03-31 DIAGNOSIS — R69 Illness, unspecified: Secondary | ICD-10-CM | POA: Diagnosis not present

## 2018-05-16 DIAGNOSIS — E781 Pure hyperglyceridemia: Secondary | ICD-10-CM | POA: Diagnosis not present

## 2018-05-16 DIAGNOSIS — E559 Vitamin D deficiency, unspecified: Secondary | ICD-10-CM | POA: Diagnosis not present

## 2018-05-16 DIAGNOSIS — R82998 Other abnormal findings in urine: Secondary | ICD-10-CM | POA: Diagnosis not present

## 2018-05-16 DIAGNOSIS — E041 Nontoxic single thyroid nodule: Secondary | ICD-10-CM | POA: Diagnosis not present

## 2018-05-21 DIAGNOSIS — R69 Illness, unspecified: Secondary | ICD-10-CM | POA: Diagnosis not present

## 2018-05-21 DIAGNOSIS — Z79891 Long term (current) use of opiate analgesic: Secondary | ICD-10-CM | POA: Diagnosis not present

## 2018-05-21 DIAGNOSIS — F250 Schizoaffective disorder, bipolar type: Secondary | ICD-10-CM | POA: Diagnosis not present

## 2018-05-23 DIAGNOSIS — F418 Other specified anxiety disorders: Secondary | ICD-10-CM | POA: Diagnosis not present

## 2018-05-23 DIAGNOSIS — Z0000 Encounter for general adult medical examination without abnormal findings: Secondary | ICD-10-CM | POA: Diagnosis not present

## 2018-05-23 DIAGNOSIS — K635 Polyp of colon: Secondary | ICD-10-CM | POA: Diagnosis not present

## 2018-05-23 DIAGNOSIS — M8580 Other specified disorders of bone density and structure, unspecified site: Secondary | ICD-10-CM | POA: Diagnosis not present

## 2018-05-23 DIAGNOSIS — F250 Schizoaffective disorder, bipolar type: Secondary | ICD-10-CM | POA: Diagnosis not present

## 2018-05-23 DIAGNOSIS — E559 Vitamin D deficiency, unspecified: Secondary | ICD-10-CM | POA: Diagnosis not present

## 2018-05-23 DIAGNOSIS — Z79899 Other long term (current) drug therapy: Secondary | ICD-10-CM | POA: Diagnosis not present

## 2018-05-23 DIAGNOSIS — R7989 Other specified abnormal findings of blood chemistry: Secondary | ICD-10-CM | POA: Diagnosis not present

## 2018-05-23 DIAGNOSIS — E669 Obesity, unspecified: Secondary | ICD-10-CM | POA: Diagnosis not present

## 2018-05-23 DIAGNOSIS — F319 Bipolar disorder, unspecified: Secondary | ICD-10-CM | POA: Diagnosis not present

## 2018-05-23 DIAGNOSIS — E041 Nontoxic single thyroid nodule: Secondary | ICD-10-CM | POA: Diagnosis not present

## 2018-05-23 DIAGNOSIS — E781 Pure hyperglyceridemia: Secondary | ICD-10-CM | POA: Diagnosis not present

## 2018-08-04 DIAGNOSIS — R69 Illness, unspecified: Secondary | ICD-10-CM | POA: Diagnosis not present

## 2018-08-04 DIAGNOSIS — F250 Schizoaffective disorder, bipolar type: Secondary | ICD-10-CM | POA: Diagnosis not present

## 2018-11-03 DIAGNOSIS — F250 Schizoaffective disorder, bipolar type: Secondary | ICD-10-CM | POA: Diagnosis not present

## 2018-11-03 DIAGNOSIS — R69 Illness, unspecified: Secondary | ICD-10-CM | POA: Diagnosis not present

## 2019-05-20 DIAGNOSIS — Z23 Encounter for immunization: Secondary | ICD-10-CM | POA: Diagnosis not present

## 2019-06-16 DIAGNOSIS — Z23 Encounter for immunization: Secondary | ICD-10-CM | POA: Diagnosis not present

## 2019-07-15 DIAGNOSIS — F250 Schizoaffective disorder, bipolar type: Secondary | ICD-10-CM | POA: Diagnosis not present

## 2019-12-30 DIAGNOSIS — Z23 Encounter for immunization: Secondary | ICD-10-CM | POA: Diagnosis not present

## 2020-01-08 DIAGNOSIS — Z23 Encounter for immunization: Secondary | ICD-10-CM | POA: Diagnosis not present

## 2020-01-11 DIAGNOSIS — F250 Schizoaffective disorder, bipolar type: Secondary | ICD-10-CM | POA: Diagnosis not present

## 2020-03-15 DIAGNOSIS — R7989 Other specified abnormal findings of blood chemistry: Secondary | ICD-10-CM | POA: Diagnosis not present

## 2020-03-15 DIAGNOSIS — R82998 Other abnormal findings in urine: Secondary | ICD-10-CM | POA: Diagnosis not present

## 2020-03-15 DIAGNOSIS — Z79899 Other long term (current) drug therapy: Secondary | ICD-10-CM | POA: Diagnosis not present

## 2020-03-15 DIAGNOSIS — E781 Pure hyperglyceridemia: Secondary | ICD-10-CM | POA: Diagnosis not present

## 2020-03-15 DIAGNOSIS — Z23 Encounter for immunization: Secondary | ICD-10-CM | POA: Diagnosis not present

## 2020-03-15 DIAGNOSIS — E559 Vitamin D deficiency, unspecified: Secondary | ICD-10-CM | POA: Diagnosis not present

## 2020-03-15 DIAGNOSIS — M8580 Other specified disorders of bone density and structure, unspecified site: Secondary | ICD-10-CM | POA: Diagnosis not present

## 2020-03-15 DIAGNOSIS — E041 Nontoxic single thyroid nodule: Secondary | ICD-10-CM | POA: Diagnosis not present

## 2020-03-15 DIAGNOSIS — Z1212 Encounter for screening for malignant neoplasm of rectum: Secondary | ICD-10-CM | POA: Diagnosis not present

## 2020-03-15 DIAGNOSIS — F250 Schizoaffective disorder, bipolar type: Secondary | ICD-10-CM | POA: Diagnosis not present

## 2020-03-15 DIAGNOSIS — Z1331 Encounter for screening for depression: Secondary | ICD-10-CM | POA: Diagnosis not present

## 2020-03-15 DIAGNOSIS — Z0000 Encounter for general adult medical examination without abnormal findings: Secondary | ICD-10-CM | POA: Diagnosis not present

## 2020-03-15 DIAGNOSIS — R030 Elevated blood-pressure reading, without diagnosis of hypertension: Secondary | ICD-10-CM | POA: Diagnosis not present

## 2020-03-27 DIAGNOSIS — Z20822 Contact with and (suspected) exposure to covid-19: Secondary | ICD-10-CM | POA: Diagnosis not present

## 2020-09-20 DIAGNOSIS — F250 Schizoaffective disorder, bipolar type: Secondary | ICD-10-CM | POA: Diagnosis not present
